# Patient Record
Sex: Female | Born: 1990 | Race: White | Hispanic: No | Marital: Single | Smoking: Current some day smoker
Health system: Southern US, Community
[De-identification: ages and names within clinical notes are randomized; demographics above are authoritative.]

## PROBLEM LIST (undated history)

## (undated) DIAGNOSIS — Q059 Spina bifida, unspecified: Secondary | ICD-10-CM

## (undated) DIAGNOSIS — F4481 Dissociative identity disorder: Secondary | ICD-10-CM

## (undated) DIAGNOSIS — F319 Bipolar disorder, unspecified: Secondary | ICD-10-CM

## (undated) DIAGNOSIS — R569 Unspecified convulsions: Secondary | ICD-10-CM

## (undated) DIAGNOSIS — F4310 Post-traumatic stress disorder, unspecified: Secondary | ICD-10-CM

---

## 2016-09-30 ENCOUNTER — Emergency Department (HOSPITAL_COMMUNITY): Payer: Medicaid Other

## 2016-09-30 ENCOUNTER — Emergency Department (HOSPITAL_COMMUNITY)
Admission: EM | Admit: 2016-09-30 | Discharge: 2016-09-30 | Disposition: A | Discharge status: Home / Self Care | Payer: Medicaid Other | Attending: Physician Assistant | Admitting: Physician Assistant

## 2016-09-30 ENCOUNTER — Encounter (HOSPITAL_COMMUNITY): Payer: Self-pay | Admitting: Emergency Medicine

## 2016-09-30 DIAGNOSIS — T401X1A Poisoning by heroin, accidental (unintentional), initial encounter: Secondary | ICD-10-CM | POA: Insufficient documentation

## 2016-09-30 DIAGNOSIS — Z9114 Patient's other noncompliance with medication regimen: Secondary | ICD-10-CM | POA: Diagnosis not present

## 2016-09-30 DIAGNOSIS — F17210 Nicotine dependence, cigarettes, uncomplicated: Secondary | ICD-10-CM | POA: Diagnosis not present

## 2016-09-30 HISTORY — DX: Bipolar disorder, unspecified: F31.9

## 2016-09-30 HISTORY — DX: Spina bifida, unspecified: Q05.9

## 2016-09-30 HISTORY — DX: Unspecified convulsions: R56.9

## 2016-09-30 HISTORY — DX: Dissociative identity disorder: F44.81

## 2016-09-30 HISTORY — DX: Post-traumatic stress disorder, unspecified: F43.10

## 2016-09-30 LAB — CBG MONITORING, ED: GLUCOSE-CAPILLARY: 111 mg/dL — AB (ref 65–99)

## 2016-09-30 MED ORDER — IBUPROFEN 200 MG PO TABS
600.0000 mg | ORAL_TABLET | Freq: Once | ORAL | Status: DC
  Filled 2016-09-30: qty 3

## 2016-09-30 MED ORDER — PHENYTOIN SODIUM EXTENDED 100 MG PO CAPS: 300.0000 mg | ORAL_CAPSULE | Freq: Once | ORAL | Status: DC

## 2016-09-30 MED ORDER — PHENYTOIN SODIUM EXTENDED 100 MG PO CAPS: 300.0000 mg | ORAL_CAPSULE | Freq: Every day | ORAL | 1 refills | Status: AC

## 2016-09-30 MED ORDER — PHENYTOIN SODIUM EXTENDED 100 MG PO CAPS
400.0000 mg | ORAL_CAPSULE | ORAL | Status: AC
  Administered 2016-09-30 (×3): 400 mg via ORAL
  Filled 2016-09-30 (×3): qty 4

## 2016-09-30 NOTE — ED Triage Notes (Signed)
Per EMS pt reported to using heroine tonight. Pt was unresponsive per friends but alert and oriented when EMS arrived. Pt currently alert and oriented and reporting chest pain. Pt states that she used half a bag of heroine tonight. [employer] at bedside with pt.

## 2016-09-30 NOTE — ED Provider Notes (Signed)
[facility]-EMERGENCY DEPT Provider Note   CSN: [id number] Arrival date & time: 09/30/16  0025  By signing my name below, I, Rosario Adie, attest that this documentation has been prepared under the direction and in the presence of Earley Favor, FNP. Electronically Signed: Rosario Adie, ED Scribe. 09/30/16. 1:29 AM.  History   Chief Complaint Chief Complaint  Patient presents with  . Drug Overdose   The history is provided by the patient and the EMS personnel. No language interpreter was used.   HPI Comments: Regina Ali is a 25 y.o. female BIB EMS, who presents to the Emergency Department for assessment s/p possible drug overdose. Pt states that she has moderate substernal CP and SOB since last using heroine and crack-cocaine approximately two hours ago. Pt notes that her significant other called EMS after she had a seizure while using. She has a hx of seizures and typically takes Dilantin for this problem. She states that she last took her Dilantin a few weeks ago, and usually takes 300mg  q.d. Pt is unsure if she was given Narcan en route, and she state that her significant other told her she was giving her chest compressions for fifteen minutes prior to her being picked up by ambulance. She states that she typically injects into her neck; however, she denies any areas of pain or swelling to her areas of typical injection. Pt reports that she has been trying to get clean and relapsed today while seeing some of her old friends. Pt is currently in her menstrual cycle. She denies Sex with men  Past Medical History:  Diagnosis Date  . Manic depression (HCC)   . Multiple personalities   . PTSD (post-traumatic stress disorder)   . Seizures (HCC)   . Spina bifida (HCC)    There are no active problems to display for this patient.  History reviewed. No pertinent surgical history.  OB History    No data available     Home Medications    Prior to Admission medications     Medication Sig Start Date End Date Taking? Authorizing Provider  phenytoin (DILANTIN) 100 MG ER capsule Take 3 capsules (300 mg total) by mouth daily. 09/30/16   Earley Favor, NP   Family History History reviewed. No pertinent family history.  Social History Social History  Substance Use Topics  . Smoking status: Current Some Day Smoker    Packs/day: 1.00    Types: Cigarettes  . Smokeless tobacco: Never Used  . Alcohol use No   Allergies   Clindamycin/lincomycin; Doxycycline; Keflex [cephalexin]; and Proctofoam [pramoxine hcl]  Review of Systems Review of Systems  Respiratory: Positive for shortness of breath.   Cardiovascular: Positive for chest pain.  Neurological: Positive for seizures.  All other systems reviewed and are negative.  Physical Exam Updated Vital Signs BP 112/70 (BP Location: Left Arm)   Pulse 75   Temp 99 F (37.2 C) (Oral)   Resp 18   Ht 5\' 5"  (1.651 m)   Wt 65.8 kg   LMP 09/30/2016   SpO2 100%   BMI 24.13 kg/m   Physical Exam  Constitutional: She appears well-developed and well-nourished.  HENT:  Head: Normocephalic.  Eyes: Conjunctivae are normal.  Cardiovascular: Normal rate, regular rhythm and normal heart sounds.   No murmur heard. Pulmonary/Chest: Effort normal and breath sounds normal. No respiratory distress. She has no wheezes. She has no rales.  Abdominal: She exhibits no distension.  Musculoskeletal: Normal range of motion. She exhibits no edema.  Neurological: She is alert.  Skin: Skin is warm and dry.  Psychiatric: She has a normal mood and affect. Her behavior is normal.  Nursing note and vitals reviewed.  ED Treatments / Results  DIAGNOSTIC STUDIES: Oxygen Saturation is 96% on RA, normal by my interpretation.   COORDINATION OF CARE: 1:27 AM-Discussed next steps with pt. Pt verbalized understanding and is agreeable with the plan.   Labs (all labs ordered are listed, but only abnormal results are displayed) Labs Reviewed   CBG MONITORING, ED - Abnormal; Notable for the following:       Result Value   Glucose-Capillary 111 (*)    All other components within normal limits   EKG  EKG Interpretation None      Radiology Dg Chest 2 View  Result Date: 09/30/2016 CLINICAL DATA:  Chest pain. Smoker. Heroin use tonight. Patient was initially found unresponsive but now alert. EXAM: CHEST  2 VIEW COMPARISON:  None. FINDINGS: The heart size and mediastinal contours are within normal limits. Both lungs are clear. The visualized skeletal structures are unremarkable. IMPRESSION: No active cardiopulmonary disease. Electronically Signed   By: Burman NievesWilliam  Stevens M.D.   On: 09/30/2016 03:12    Procedures Procedures (including critical care time)  Medications Ordered in ED Medications  phenytoin (DILANTIN) ER capsule 400 mg (400 mg Oral Given 09/30/16 0358)  ibuprofen (ADVIL,MOTRIN) tablet 600 mg (600 mg Oral Refused 09/30/16 0201)    Initial Impression / Assessment and Plan / ED Course  I have reviewed the triage vital signs and the nursing notes.  Pertinent labs & imaging results that were available during my care of the patient were reviewed by me and considered in my medical decision making (see chart for details).  Clinical Course   Pt is a 25yo female who presents into the ED after injecting heroine and smoking crack-cocaine two hours prior to her evaluation. She states that she has very moderate chest pain and mild shortness of breath after waking up in the hospital; however, she reported that her girlfriend performed chest compressions on her for "fifteen minutes prior to being picked up". Pt additionally notes that she did have a seizure, and has been off of her 300mg  Dilantin prescription over the past several weeks prior to today. Will give oral load (18mg  per kilo) of Dilantin while in the ED, and send her home with a prescription to take 300mg  q.d. Will also give Ibuprofen while in the ED for her pain  management. Patient is to be discharged with recommendation to follow up with PCP in regards to today's hospital visit. Chest pain is not likely of cardiac or pulmonary etiology d/t presentation. EKG is w/o abnormality. Pt is comfortable and agreeable with the plan as detailed above. All questions were answered prior to disposition.   Patient was given outpatient substance abuse resources  Final Clinical Impressions(s) / ED Diagnoses   Final diagnoses:  Accidental overdose of heroin, initial encounter  Noncompliance with medications   Patient will be given Dilantin load by mouth chest x-ray is normal.  EKG within normal parameters. New Prescriptions New Prescriptions   PHENYTOIN (DILANTIN) 100 MG ER CAPSULE    Take 3 capsules (300 mg total) by mouth daily.        Earley FavorGail Jazlynne Milliner, NP 09/30/16 0518    Earley FavorGail Zosia Lucchese, NP 09/30/16 0518    Courteney Randall AnLyn Mackuen, MD 09/30/16 [id number]

## 2016-09-30 NOTE — ED Notes (Addendum)
NP at bedside. Pt has removed EKG leads and BP cuff. Pt placed shirt back on. Pt alert and oriented x 4. Pt refused to have cardiac monitoring reapplied and stated that BP cuff to be taken off after reading.

## 2016-09-30 NOTE — Discharge Instructions (Signed)
Be given a prescription for your Dilantin.  Please take this on a regular basis.  She was given a oral load in the emergency Department, you should take a dose Monday evening and then daily after thatSubstance Abuse Treatment Programs  Intensive Outpatient Programs [facility]     [address]      [place], [place]                   [phone]       [facility] [address] [place], [place] [phone]  [facility]     (Inpatient and outpatient)     [address]           [phone]    [facility] [phone] (Suboxone and Methadone)  [address]      [place], [place] [address]      [phone]       [address] [place], [place] [phone]  [facility] (Outpatient/Inpatient, [employer])    (insurance only) [phone]             [facility] (Groups & Residential) [place], [place] [phone]     [facility]     [address]     [place], [place]      [phone]       [facility] (for caregivers and family) [phone] [place], [place] [phone]      [facility]idential Treatment Programs [facility]      [address], [place], [place] [address]  [phone]       [facility] [address] [place], [place] [address] [phone]  [facility]        [phone]       [facility] [phone]  [facility]             [address]                                         [place], [place]                                                [phone] or [phone]                               [facility] [place] [address] [place], [phone]  [facility]    [address]      [place], [place]     [phone]       [facility] [address] [place], [place] [phone]  [facility]   [address]     [place], [place] [address]     [phone]      Admissions: 8am-3pm M-F  [facility] ([facility]) [address] [place], [place] [phone]  [facility]: Residential Program [phone](90 Days)   [place], [place]      [phone] or [phone]     [facility]: [facility] [place], [place] (Walk in Hours over the weekend or by referral)  [facility] [address] [place],  [place], [place] [address] [phone]  Crisis Mobile: [facility]:  6026250878 (for crisis response 24 hours a day) [facility] Hotline:      [phone] Outpatient Psychiatry and Counseling  [facility]: Mobile Crisis Management 24 hours:  [phone]  [facility] of [facility] [employer] sliding scale fee and walk in schedule: M-F 8am-12pm/1pm-3pm [address]  [place], [place] [address] [phone]  [facility] [address] [place], [place] [address] [phone]  [facility] [employer])- new patient walk-in appointments available Monday - Friday 8am -3pm.          [address] [place], [place] [address] [phone] or crisis line- [phone]  [facility]/ Intensive Outpatient Therapy Program [address] [place], [place] [address] [phone]  [facility]                  Crisis Services      [phone] [address]     [place], [place] [address]                 [place] [facility]   [facility] [phone]. [address] [place], [place] [address]   [employer] of Care          [address]  [place], [place] [address]       [phone]  [facility] [address] [place], [place] [address] [phone]  [facility]    [address]    [place],  [place] [address]     [phone]       Andee Poles, MD     [address]     [place] [address]     [phone]       [facility] [address] [place] [address]  [facility]     [address] [place], [place]      [phone]       [facility] Eulogio Ditch, MD [address] [place], [place] [address] [phone]  [facility]     [address]     [place], [place] [address]     [phone]       [facility] [address] [place], [place] [address] [phone] Resources for Temporary Residential Assistance/Crisis Centers  [facility]) M-F 8am-3pm   [address] [place], [place] [address]   [phone] Services include: laundry, barbering, support groups, case management, phone  & computer access, showers, AA/NA mtgs, mental health/substance abuse nurse, job skills class, disability information, VA assistance, spiritual classes, etc.   HOMELESS SHELTERS  [facility]     [facility]   [address] [place]     [phone]              [employer] (women and children)       [address] [place], [place] [address] [phone] [email or web address] for application and process Application Required  [facility] [employer] [facility]   [address]    [place] [address]     [phone]                    [facility] [address] [place],  [place] [address] [phone]([email or web address] application appt.) Application Required  [facility] (women only)    [address]     [place], [place] [address]     [phone]      Intake starts 6pm daily Need valid ID, SSC, & Police report [employer] [address] [place], [place] [phone] Application Required  [employer] (men only)     [address]      [place], [place]     [phone]       [facility] of the [place] (Pregnant women only) [address] [place], [place] [phone]  [facility]      [address]      [place], [place] [address]     [phone]             [facility] [address] [place], [place] [phone] 90 day commitment/SA/Application process  [facility](men only)     [address]     [place], [place]     [phone]       Check-in at [facility] [address] [place], [place] [address] [phone] Men/Women/Women and Children must be there by 7 pm  [facility] [place], [place] [phone]

## 2016-09-30 NOTE — ED Notes (Signed)
Pt refused Ibuprofen after stating that it would not help with pain. Pt then ambulated to restroom without any difficulty.

## 2016-09-30 NOTE — ED Notes (Signed)
Pt left department before signing for discharge information.

## 2016-09-30 NOTE — ED Notes (Signed)
Pt awake and currently calling for ride home.

## 2016-09-30 NOTE — ED Notes (Signed)
Pt reports that she had no SI/HI pt drowsy during triage. Pt states that she used Heroine tonight "to knock off the edge."

## 2016-09-30 NOTE — ED Notes (Signed)
Pt given turkey sandwich and water

## 2016-09-30 NOTE — ED Notes (Signed)
Bed: [id number] Expected date:  Expected time:  Means of arrival:  Comments: 25 OD at 6p

## 2017-02-06 IMAGING — CR DG CHEST 2V
2 series · 2 of 2 positions shown · non-contrast
Comparison: None.

CLINICAL DATA: Chest pain. Smoker. Heroin use tonight. Patient was
initially found unresponsive but now alert.

EXAM:
CHEST  2 VIEW

[w chest pa]
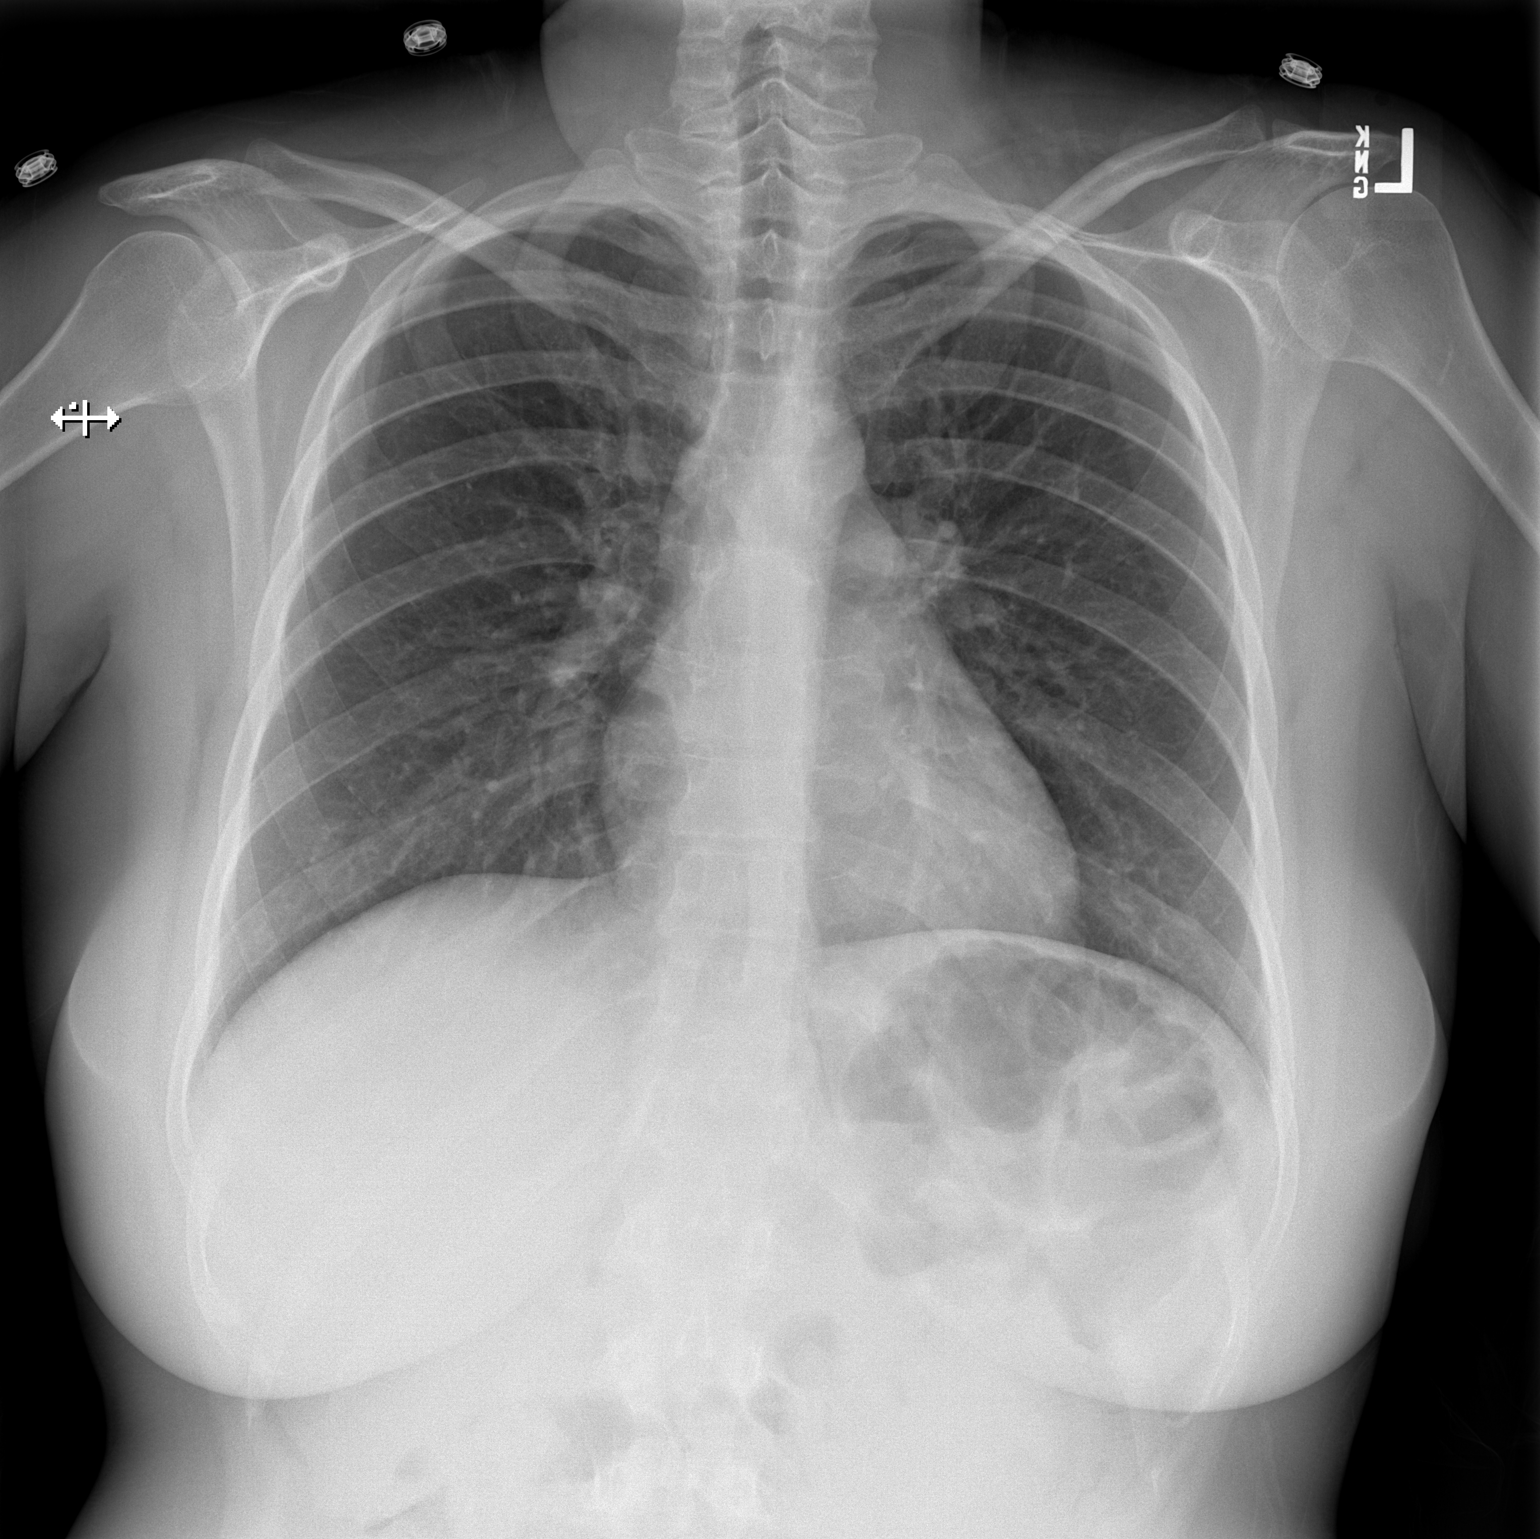

[w chest lat]
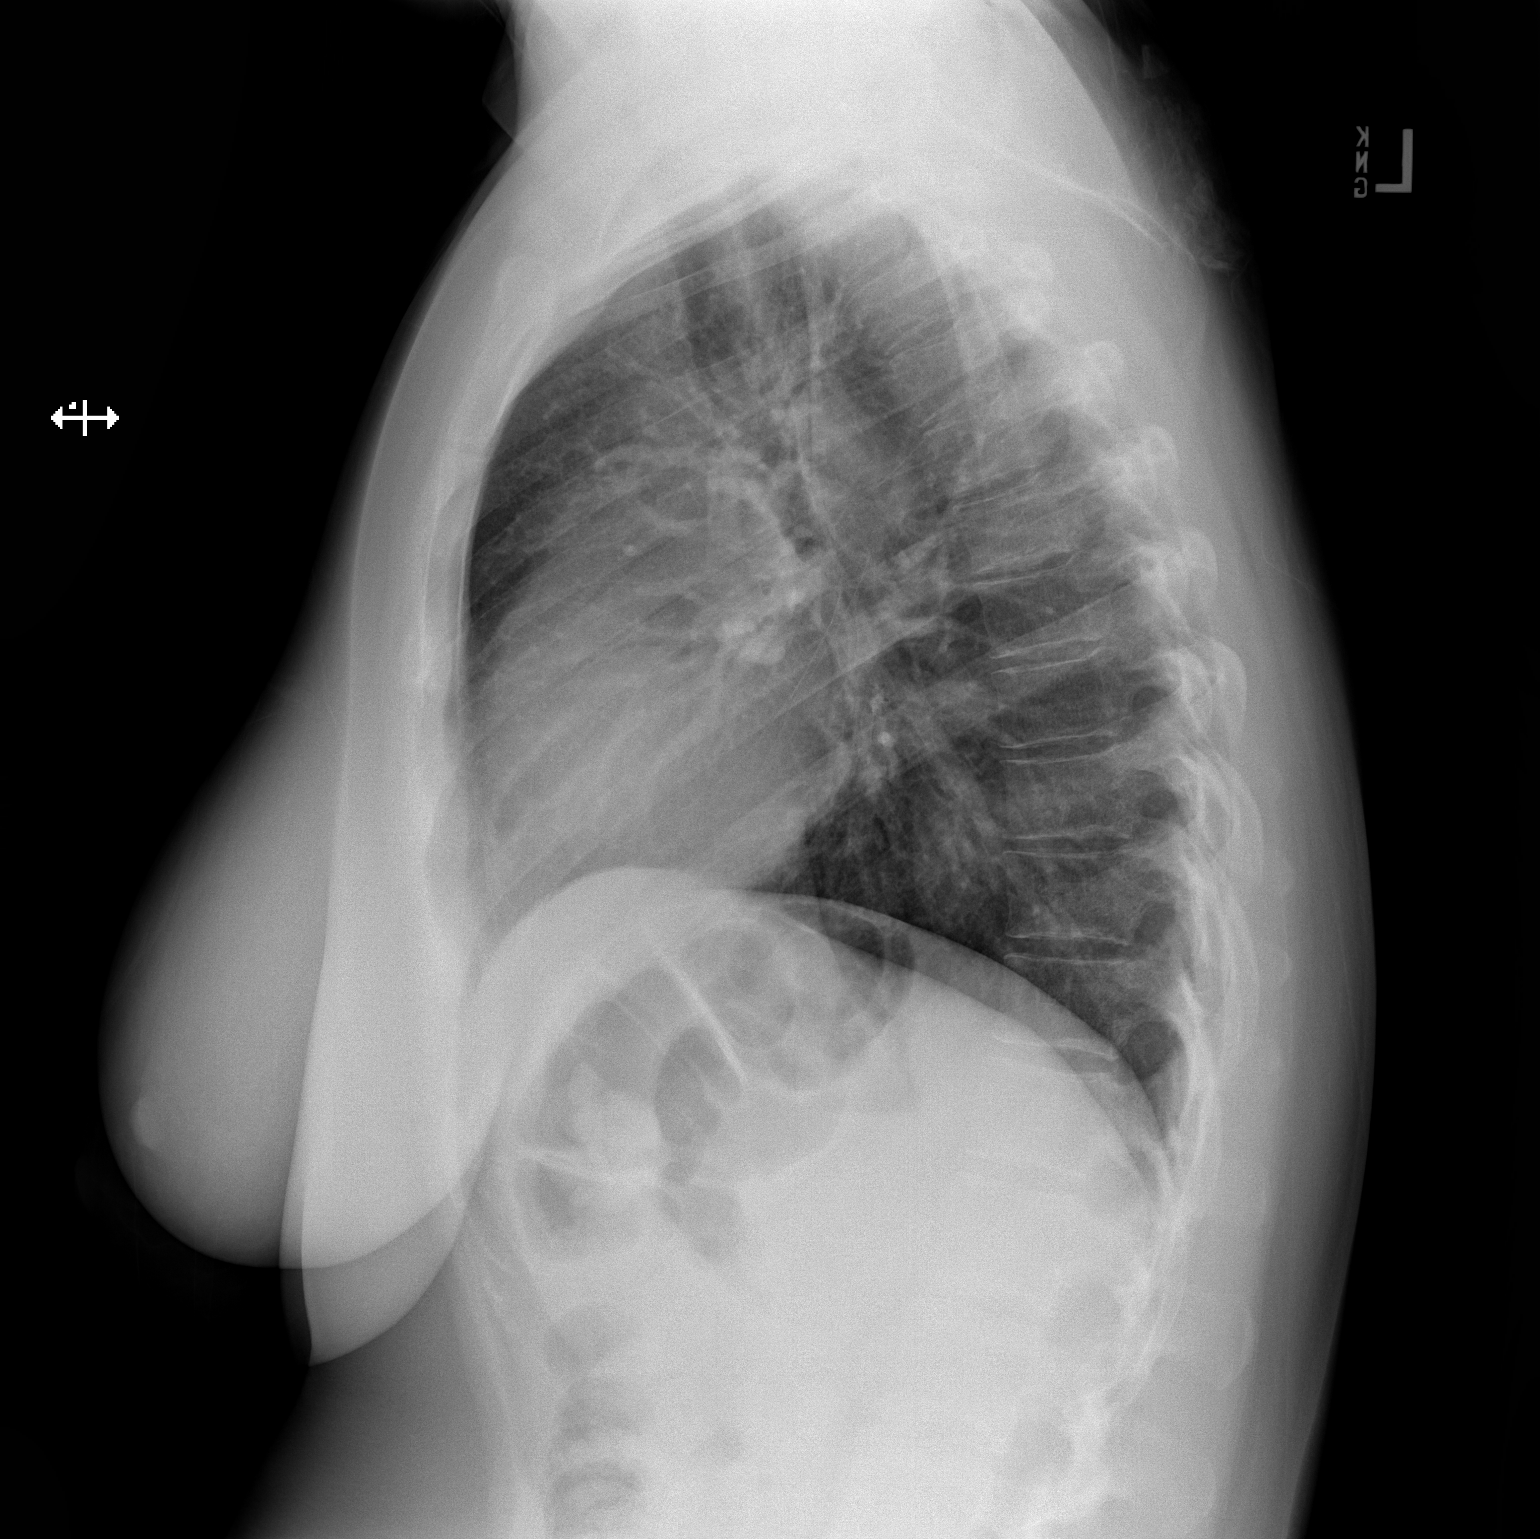

[2 of 2 positions shown; findings below may reference images not displayed]

FINDINGS: The heart size and mediastinal contours are within normal limits.
Both lungs are clear. The visualized skeletal structures are
unremarkable.
IMPRESSION: No active cardiopulmonary disease.
